# Patient Record
Sex: Female | Born: 1970 | Race: White | Hispanic: No | Marital: Married | Smoking: Never smoker
Health system: Southern US, Community
[De-identification: ages and names within clinical notes are randomized; demographics above are authoritative.]

## PROBLEM LIST (undated history)

## (undated) DIAGNOSIS — Z889 Allergy status to unspecified drugs, medicaments and biological substances status: Secondary | ICD-10-CM

## (undated) DIAGNOSIS — K219 Gastro-esophageal reflux disease without esophagitis: Secondary | ICD-10-CM

---

## 2015-01-17 ENCOUNTER — Emergency Department (HOSPITAL_COMMUNITY): Payer: Self-pay

## 2015-01-17 ENCOUNTER — Encounter (HOSPITAL_COMMUNITY): Payer: Self-pay | Admitting: Emergency Medicine

## 2015-01-17 ENCOUNTER — Emergency Department (HOSPITAL_COMMUNITY)
Admission: EM | Admit: 2015-01-17 | Discharge: 2015-01-17 | Disposition: A | Discharge status: Home / Self Care | Payer: Self-pay | Attending: Emergency Medicine | Admitting: Emergency Medicine

## 2015-01-17 DIAGNOSIS — R0789 Other chest pain: Secondary | ICD-10-CM | POA: Insufficient documentation

## 2015-01-17 DIAGNOSIS — R079 Chest pain, unspecified: Secondary | ICD-10-CM

## 2015-01-17 DIAGNOSIS — K219 Gastro-esophageal reflux disease without esophagitis: Secondary | ICD-10-CM | POA: Insufficient documentation

## 2015-01-17 DIAGNOSIS — Z79899 Other long term (current) drug therapy: Secondary | ICD-10-CM | POA: Insufficient documentation

## 2015-01-17 HISTORY — DX: Gastro-esophageal reflux disease without esophagitis: K21.9

## 2015-01-17 LAB — BASIC METABOLIC PANEL
ANION GAP: 10 (ref 5–15)
BUN: 17 mg/dL (ref 6–20)
CALCIUM: 10.4 mg/dL — AB (ref 8.9–10.3)
CO2: 31 mmol/L (ref 22–32)
Chloride: 102 mmol/L (ref 101–111)
Creatinine, Ser: 1.1 mg/dL — ABNORMAL HIGH (ref 0.44–1.00)
GLUCOSE: 90 mg/dL (ref 65–99)
POTASSIUM: 4 mmol/L (ref 3.5–5.1)
Sodium: 143 mmol/L (ref 135–145)

## 2015-01-17 LAB — CBC
HEMATOCRIT: 42.7 % (ref 36.0–46.0)
HEMOGLOBIN: 14.4 g/dL (ref 12.0–15.0)
MCH: 30.8 pg (ref 26.0–34.0)
MCHC: 33.7 g/dL (ref 30.0–36.0)
MCV: 91.2 fL (ref 78.0–100.0)
Platelets: 191 10*3/uL (ref 150–400)
RBC: 4.68 MIL/uL (ref 3.87–5.11)
RDW: 12.4 % (ref 11.5–15.5)
WBC: 9.7 10*3/uL (ref 4.0–10.5)

## 2015-01-17 LAB — I-STAT TROPONIN, ED: TROPONIN I, POC: 0 ng/mL (ref 0.00–0.08)

## 2015-01-17 MED ORDER — KETOROLAC TROMETHAMINE 30 MG/ML IJ SOLN
30.0000 mg | Freq: Once | INTRAMUSCULAR | Status: AC
  Administered 2015-01-17: 30 mg via INTRAVENOUS
  Filled 2015-01-17: qty 1

## 2015-01-17 MED ORDER — NAPROXEN 500 MG PO TABS: 500.0000 mg | ORAL_TABLET | Freq: Two times a day (BID) | ORAL | Status: AC

## 2015-01-17 NOTE — ED Provider Notes (Signed)
CSN: [id number]     Arrival date & time 01/17/15  1952 History   First MD Initiated Contact with Patient 01/17/15 2000     Chief Complaint  Patient presents with  . Chest Pain     (Consider location/radiation/quality/duration/timing/severity/associated sxs/prior Treatment) HPI  44 year old female presents with chest tightness and chest pain. Started last night when she was going to bed. Was mild and felt like her GERD associated to a ranitidine which resolved her pain. However today around 12 PM she has recurrence of the pain that was more severe. Did not radiate but occasionally feels like a spasm and tightening in her left chest. Does not hurt to breathe. No exertional symptoms. Pain has been constant although does occasionally worsen throughout the day. No shortness of breath, although sometimes it feels like she can't get in a full breath. No diaphoresis or vomiting. No radiation of the pain. Denies any history of hypertension, hyperlipidemia, diabetes, coronary disease, early coronary disease in her family, or smoking. No leg swelling, leg pain, immobilization, estrogen use, or hemoptysis. Rates her pain as an 8.  Past Medical History  Diagnosis Date  . GERD (gastroesophageal reflux disease)    History reviewed. No pertinent past surgical history. No family history on file. Social History  Substance Use Topics  . Smoking status: Never Smoker   . Smokeless tobacco: None  . Alcohol Use: No   OB History    No data available     Review of Systems  Constitutional: Negative for fever.  Respiratory: Negative for cough and shortness of breath.   Cardiovascular: Positive for chest pain. Negative for leg swelling.  Gastrointestinal: Negative for vomiting and abdominal pain.  All other systems reviewed and are negative.     Allergies  Review of patient's allergies indicates no known allergies.  Home Medications   Prior to Admission medications   Medication Sig Start Date End  Date Taking? Authorizing Provider  ranitidine (ZANTAC) 150 MG tablet Take 150 mg by mouth 2 (two) times daily.   Yes Historical Provider, MD  naproxen (NAPROSYN) 500 MG tablet Take 1 tablet (500 mg total) by mouth 2 (two) times daily. 01/17/15   Pricilla Loveless, MD   BP 125/75 mmHg  Pulse 55  Temp(Src) 97.5 F (36.4 C) (Oral)  Resp 15  Wt 163 lb 6.4 oz (74.118 kg)  SpO2 99% Physical Exam  Constitutional: She is oriented to person, place, and time. She appears well-developed and well-nourished.  HENT:  Head: Normocephalic and atraumatic.  Right Ear: External ear normal.  Left Ear: External ear normal.  Nose: Nose normal.  Eyes: Right eye exhibits no discharge. Left eye exhibits no discharge.  Cardiovascular: Normal rate, regular rhythm and normal heart sounds.   Pulmonary/Chest: Effort normal and breath sounds normal. She exhibits tenderness.    Abdominal: Soft. There is no tenderness.  Musculoskeletal: She exhibits no edema.  Neurological: She is alert and oriented to person, place, and time.  Skin: Skin is warm and dry.  Nursing note and vitals reviewed.   ED Course  Procedures (including critical care time) Labs Review Labs Reviewed  BASIC METABOLIC PANEL - Abnormal; Notable for the following:    Creatinine, Ser 1.10 (*)    Calcium 10.4 (*)    All other components within normal limits  CBC  I-STAT TROPOININ, ED  Rosezena Sensor, ED    Imaging Review Dg Chest 2 View  01/17/2015  CLINICAL DATA:  Chest tightness and shortness of breath. EXAM: CHEST  2 VIEW COMPARISON:  None. FINDINGS: Normal sized heart.  Clear lungs.  Minimal scoliosis. IMPRESSION: No acute abnormality. Electronically Signed   By: Beckie SaltsSteven  Reid M.D.   On: 01/17/2015 20:42   I have personally reviewed and evaluated these images and lab results as part of my medical decision-making.   EKG Interpretation   Date/Time:  Friday January 17 2015 19:57:51 EDT Ventricular Rate:  88 PR Interval:  150 QRS  Duration: 98 QT Interval:  362 QTC Calculation: 438 R Axis:   60 Text Interpretation:  Normal sinus rhythm with sinus arrhythmia Incomplete  right bundle branch block Borderline ECG no acute ST/T changes No old  tracing to compare Confirmed by Ashley Montminy  MD, Pearlena Ow ([id number]) on 01/17/2015  8:08:46 PM      MDM   Final diagnoses:  Chest pain, unspecified chest pain type    Patient is low risk for ACS. Is also low risk for pulmonary embolism and is PERC negative. Do not feel further testing is warranted for PE. Initial EKG unremarkable, initial troponin after 8 hours of continuous symptoms is negative. I have recommended a second troponin. Her pain is significantly better after IV Toradol. Given her reproducible symptoms as well as having a URI last week and think this is most likely a costochondritis. She does not want to wait on the second troponin that she needs to get home to her family. I've counseled on the risks of a possible missed MI although do feel this is less likely. She excepts this and will go home and will return if any symptoms worsen. She has plans to see her PCP as soon as possible the next couple days.    Pricilla LovelessScott Chrisann Melaragno, MD 01/18/15 [phone]

## 2015-01-17 NOTE — ED Notes (Signed)
MD at bedside. 

## 2015-01-17 NOTE — ED Notes (Signed)
Pt. reports intermittent left chest pain " spasms" onset last night with SOB and nausea , denies diaphoresis .

## 2017-01-23 ENCOUNTER — Telehealth (HOSPITAL_BASED_OUTPATIENT_CLINIC_OR_DEPARTMENT_OTHER): Payer: Self-pay | Admitting: Emergency Medicine

## 2017-01-23 ENCOUNTER — Other Ambulatory Visit: Payer: Self-pay

## 2017-01-23 ENCOUNTER — Emergency Department (HOSPITAL_BASED_OUTPATIENT_CLINIC_OR_DEPARTMENT_OTHER): Payer: Self-pay

## 2017-01-23 ENCOUNTER — Encounter (HOSPITAL_BASED_OUTPATIENT_CLINIC_OR_DEPARTMENT_OTHER): Payer: Self-pay | Admitting: *Deleted

## 2017-01-23 ENCOUNTER — Emergency Department (HOSPITAL_BASED_OUTPATIENT_CLINIC_OR_DEPARTMENT_OTHER)
Admission: EM | Admit: 2017-01-23 | Discharge: 2017-01-23 | Disposition: A | Discharge status: Home / Self Care | Payer: Self-pay | Attending: Emergency Medicine | Admitting: Emergency Medicine

## 2017-01-23 DIAGNOSIS — S82831A Other fracture of upper and lower end of right fibula, initial encounter for closed fracture: Secondary | ICD-10-CM | POA: Insufficient documentation

## 2017-01-23 DIAGNOSIS — W010XXA Fall on same level from slipping, tripping and stumbling without subsequent striking against object, initial encounter: Secondary | ICD-10-CM | POA: Insufficient documentation

## 2017-01-23 DIAGNOSIS — Y999 Unspecified external cause status: Secondary | ICD-10-CM | POA: Insufficient documentation

## 2017-01-23 DIAGNOSIS — Z79899 Other long term (current) drug therapy: Secondary | ICD-10-CM | POA: Insufficient documentation

## 2017-01-23 DIAGNOSIS — Y9389 Activity, other specified: Secondary | ICD-10-CM | POA: Insufficient documentation

## 2017-01-23 DIAGNOSIS — Y929 Unspecified place or not applicable: Secondary | ICD-10-CM | POA: Insufficient documentation

## 2017-01-23 HISTORY — DX: Allergy status to unspecified drugs, medicaments and biological substances: Z88.9

## 2017-01-23 MED ORDER — IBUPROFEN 800 MG PO TABS: 800.0000 mg | ORAL_TABLET | Freq: Three times a day (TID) | ORAL | 0 refills | Status: AC

## 2017-01-23 MED ORDER — HYDROCODONE-ACETAMINOPHEN 5-325 MG PO TABS
2.0000 | ORAL_TABLET | Freq: Once | ORAL | Status: AC
  Administered 2017-01-23: 2 via ORAL
  Filled 2017-01-23: qty 2

## 2017-01-23 MED ORDER — HYDROCODONE-ACETAMINOPHEN 5-325 MG PO TABS: 2.0000 | ORAL_TABLET | ORAL | 0 refills | Status: DC | PRN

## 2017-01-23 NOTE — Discharge Instructions (Signed)
Your ankle shows that you have a fracture, we have applied a splint.  Use crutches.  Do not bear any weight on your ankle.  You may need surgery for this.  Alternatively this may heal very well without surgery but the orthopedic surgeon will need to see you in the office to make that determination.  You may take ibuprofen 3 times a day.  You may take hydrocodone with acetaminophen every 4-6 hours as needed for pain.  Please be aware that this can cause constipation so take a stool softener with it.

## 2017-01-23 NOTE — ED Provider Notes (Signed)
[facility] EMERGENCY DEPARTMENT Provider Note   CSN: [id number] Arrival date & time: 01/23/17  0720     History   Chief Complaint Chief Complaint  Patient presents with  . Ankle Pain    HPI Alicia Nunez is a 46 y.o. female.  HPI  The patient states that approximately 1-1/2 hours ago she was walking her dog when she slipped on frosty ground wearing flip-flops, she fell turning her right ankle and having acute onset of pain.  She is able to ambulate extremely difficulty with a lot of pain in the right ankle.  She has been avoiding that.  She does notice some swelling with it.  The pain is constant, severe, worse with ambulation.  Denies any knee pain or any other injuries.  Past Medical History:  Diagnosis Date  . GERD (gastroesophageal reflux disease)   . H/O seasonal allergies     There are no active problems to display for this patient.   Past Surgical History:  Procedure Laterality Date  . CESAREAN SECTION      OB History    No data available       Home Medications    Prior to Admission medications   Medication Sig Start Date End Date Taking? Authorizing Provider  Fexofenadine HCl (ALLEGRA ALLERGY PO) Take by mouth.   Yes [provider]  HYDROcodone-acetaminophen (NORCO/VICODIN) 5-325 MG tablet Take 2 tablets every 4 (four) hours as needed by mouth. 01/23/17   Eber HongMiller, Chameka Mcmullen, MD  ibuprofen (ADVIL,MOTRIN) 800 MG tablet Take 1 tablet (800 mg total) 3 (three) times daily by mouth. 01/23/17   Eber HongMiller, Oliver Heitzenrater, MD  naproxen (NAPROSYN) 500 MG tablet Take 1 tablet (500 mg total) by mouth 2 (two) times daily. 01/17/15   Pricilla LovelessGoldston, Scott, MD  ranitidine (ZANTAC) 150 MG tablet Take 150 mg by mouth 2 (two) times daily.    [provider]    Family History No family history on file.  Social History Social History   Tobacco Use  . Smoking status: Never Smoker  . Smokeless tobacco: Never Used  Substance Use Topics  . Alcohol use: No  .  Drug use: No     Allergies   Patient has no known allergies.   Review of Systems Review of Systems  Gastrointestinal: Negative for nausea and vomiting.  Musculoskeletal: Positive for joint swelling ( ankle). Negative for back pain and neck pain.  Neurological: Negative for weakness and numbness.     Physical Exam Updated Vital Signs BP (!) 142/85 (BP Location: Right Arm)   Pulse (!) 112   Temp 97.6 F (36.4 C)   LMP  (Exact Date)   SpO2 100%   Physical Exam  Constitutional: She appears well-developed and well-nourished. No distress.  HENT:  Head: Normocephalic and atraumatic.  Eyes: Conjunctivae are normal. No scleral icterus.  Cardiovascular: Normal rate, regular rhythm and intact distal pulses.  Pulmonary/Chest: Effort normal and breath sounds normal.  Musculoskeletal: She exhibits tenderness ( Pain and swelling over the right lateral malleolus.  There is mild deformity due to the swelling laterally.  Decreased Dieguez of motion of the foot.) and deformity. She exhibits no edema.  Neurological: She is alert.  Sensation intact distal to the ankle.  Skin: Skin is warm and dry. No rash noted. She is not diaphoretic.  HEENT intact, no open wounds  Nursing note and vitals reviewed.    ED Treatments / Results  Labs (all labs ordered are listed, but only abnormal results are  displayed) Labs Reviewed - No data to display   Radiology Dg Ankle Complete Right  Result Date: 01/23/2017 CLINICAL DATA:  Twisting injury to the right ankle.  Pain. EXAM: RIGHT ANKLE - COMPLETE 3+ VIEW COMPARISON:  None. FINDINGS: There is a comminuted oblique fracture of the distal fibula with mild posterior dislocation of the distal fracture fragment. The fracture line extends to ankle mortise, which does not appear widened. There is an associated soft tissue swelling about the lateral ankle. IMPRESSION: Comminution oblique mildly displaced intra-articular distal fibular fracture. Electronically  Signed   By: Ted Mcalpineobrinka  Dimitrova M.D.   On: 01/23/2017 07:53    Procedures .Splint Application Date/Time: 01/23/2017 8:15 AM Performed by: Eber HongMiller, Natonya Finstad, MD Authorized by: Eber HongMiller, Cenia Zaragosa, MD   Consent:    Consent obtained:  Verbal   Consent given by:  Patient   Risks discussed:  Numbness, pain, swelling and discoloration   Alternatives discussed:  No treatment Pre-procedure details:    Sensation:  Normal   Skin color:  Normal Procedure details:    Laterality:  Right   Location:  Ankle   Ankle:  R ankle   Cast type:  Short leg (stirrup and posterior)   Supplies:  Cotton padding and Ortho-Glass Post-procedure details:    Pain:  Improved   Sensation:  Normal   Patient tolerance of procedure:  Tolerated well, no immediate complications   (including critical care time)  Medications Ordered in ED Medications  HYDROcodone-acetaminophen (NORCO/VICODIN) 5-325 MG per tablet 2 tablet (2 tablets Oral Given 01/23/17 [id number])     Initial Impression / Assessment and Plan / ED Course  I have reviewed the triage vital signs and the nursing notes.  Pertinent labs & imaging results that were available during my care of the patient were reviewed by me and considered in my medical decision making (see chart for details).     Evaluate for fracture, likely sprain versus distal fibular fracture, rice therapy, patient agreeable to the plan.  I have interpreted and reviewed the x-rays, a complete right ankle series was performed, there is a comminuted minimally posterior displaced distal fibular fragment with retention of the ankle mortise.  I have performed the splinting.  The patient has been instructed on care of this injury, she will be given prescriptions for both ibuprofen and 10 hydrocodone tablets in the referral to the orthopedist on call, Dr. Roda ShuttersXu.  Patient expressed her understanding.  Final Clinical Impressions(s) / ED Diagnoses   Final diagnoses:  Closed fracture of distal end of  right fibula, unspecified fracture morphology, initial encounter    ED Discharge Orders        Ordered    ibuprofen (ADVIL,MOTRIN) 800 MG tablet  3 times daily     01/23/17 0758    HYDROcodone-acetaminophen (NORCO/VICODIN) 5-325 MG tablet  Every 4 hours PRN     01/23/17 0758       Eber HongMiller, Anari Evitt, MD 01/23/17 [phone]

## 2017-01-23 NOTE — ED Triage Notes (Signed)
Pt states that she was walking her dog this morning in her flip flops when she slid on the wet grass and her right ankle went one way and her body the other. Swelling noted to right outer aspect of ankle. No other injury noted. Ice pack placed to ankle and elevated. Awaiting xray.

## 2017-01-23 NOTE — ED Notes (Signed)
MD applied and reviewed splint.

## 2017-01-25 ENCOUNTER — Encounter (INDEPENDENT_AMBULATORY_CARE_PROVIDER_SITE_OTHER): Payer: Self-pay | Admitting: Orthopaedic Surgery

## 2017-01-25 ENCOUNTER — Ambulatory Visit (INDEPENDENT_AMBULATORY_CARE_PROVIDER_SITE_OTHER): Payer: Self-pay | Admitting: Orthopaedic Surgery

## 2017-01-25 DIAGNOSIS — S8261XA Displaced fracture of lateral malleolus of right fibula, initial encounter for closed fracture: Secondary | ICD-10-CM

## 2017-01-25 NOTE — Progress Notes (Signed)
   Office Visit Note   Patient: Alicia Nunez A Whetsel           Date of Birth: 12/08/1970           MRN: [id number] Visit Date: 01/25/2017              Requested by: Lester CarolinaMcFadden, John C., MD [address] [place], [place] [address] PCP: Lester CarolinaMcFadden, John C., MD   Assessment & Plan: Visit Diagnoses:  1. Displaced fracture of lateral malleolus of right fibula, initial encounter for closed fracture     Plan: Impression is displaced right lateral malleolus fracture.  X-rays were reviewed with the patient and her husband.  Recommendation is for ORIF.  We discussed the risk benefits alternatives of surgery and they understand and wish to proceed.  We will plan on surgery tomorrow as an outpatient.  Follow-Up Instructions: Return in about 2 weeks (around 02/08/2017).   Orders:  No orders of the defined types were placed in this encounter.  No orders of the defined types were placed in this encounter.     Procedures: No procedures performed   Clinical Data: No additional findings.   Subjective: Chief Complaint  Patient presents with  . Right Ankle - Pain    Alicia Nunez is a 46 year old female comes in with a displaced right fibula fracture from a mechanical fall she sustained while walking her dog Sunday morning.  She endorses pain and discomfort.  The pain is worse with activity and better with elevation and rest.  Denies any numbness and tingling.  Denies any radiation of pain.    Review of Systems  Constitutional: Negative.   HENT: Negative.   Eyes: Negative.   Respiratory: Negative.   Cardiovascular: Negative.   Endocrine: Negative.   Musculoskeletal: Negative.   Neurological: Negative.   Hematological: Negative.   Psychiatric/Behavioral: Negative.   All other systems reviewed and are negative.    Objective: Vital Signs: There were no vitals taken for this visit.  Physical Exam  Constitutional: She is oriented to person, place, and time. She appears well-developed and  well-nourished.  HENT:  Head: Normocephalic and atraumatic.  Eyes: EOM are normal.  Neck: Neck supple.  Pulmonary/Chest: Effort normal.  Abdominal: Soft.  Neurological: She is alert and oriented to person, place, and time.  Skin: Skin is warm. Capillary refill takes less than 2 seconds.  Psychiatric: She has a normal mood and affect. Her behavior is normal. Judgment and thought content normal.  Nursing note and vitals reviewed.   Ortho Exam Right ankle exam shows mild swelling without any neurovascular compromise.  Foot is warm well perfused. Specialty Comments:  No specialty comments available.  Imaging: No results found.   PMFS History: Patient Active Problem List   Diagnosis Date Noted  . Displaced fracture of lateral malleolus of right fibula, initial encounter for closed fracture 01/25/2017   Past Medical History:  Diagnosis Date  . GERD (gastroesophageal reflux disease)   . H/O seasonal allergies     History reviewed. No pertinent family history.  Past Surgical History:  Procedure Laterality Date  . CESAREAN SECTION     Social History   Occupational History  . Not on file  Tobacco Use  . Smoking status: Never Smoker  . Smokeless tobacco: Never Used  Substance and Sexual Activity  . Alcohol use: No  . Drug use: No  . Sexual activity: Not on file

## 2017-01-26 ENCOUNTER — Ambulatory Visit (HOSPITAL_COMMUNITY): Payer: Self-pay

## 2017-01-26 ENCOUNTER — Other Ambulatory Visit (INDEPENDENT_AMBULATORY_CARE_PROVIDER_SITE_OTHER): Payer: Self-pay | Admitting: Orthopaedic Surgery

## 2017-01-26 ENCOUNTER — Ambulatory Visit (HOSPITAL_BASED_OUTPATIENT_CLINIC_OR_DEPARTMENT_OTHER): Payer: Self-pay | Admitting: Certified Registered Nurse Anesthetist

## 2017-01-26 ENCOUNTER — Ambulatory Visit (HOSPITAL_BASED_OUTPATIENT_CLINIC_OR_DEPARTMENT_OTHER)
Admission: RE | Admit: 2017-01-26 | Discharge: 2017-01-26 | Disposition: A | Discharge status: Home / Self Care | Payer: Self-pay | Source: Ambulatory Visit | Attending: Orthopaedic Surgery | Admitting: Orthopaedic Surgery

## 2017-01-26 ENCOUNTER — Other Ambulatory Visit: Payer: Self-pay

## 2017-01-26 ENCOUNTER — Encounter (HOSPITAL_BASED_OUTPATIENT_CLINIC_OR_DEPARTMENT_OTHER)
Admission: RE | Disposition: A | Discharge status: Home / Self Care | Payer: Self-pay | Source: Ambulatory Visit | Attending: Orthopaedic Surgery

## 2017-01-26 ENCOUNTER — Encounter (HOSPITAL_BASED_OUTPATIENT_CLINIC_OR_DEPARTMENT_OTHER): Payer: Self-pay

## 2017-01-26 DIAGNOSIS — S82891A Other fracture of right lower leg, initial encounter for closed fracture: Secondary | ICD-10-CM

## 2017-01-26 DIAGNOSIS — X58XXXA Exposure to other specified factors, initial encounter: Secondary | ICD-10-CM | POA: Insufficient documentation

## 2017-01-26 DIAGNOSIS — Z79891 Long term (current) use of opiate analgesic: Secondary | ICD-10-CM | POA: Insufficient documentation

## 2017-01-26 DIAGNOSIS — S8261XA Displaced fracture of lateral malleolus of right fibula, initial encounter for closed fracture: Secondary | ICD-10-CM | POA: Insufficient documentation

## 2017-01-26 DIAGNOSIS — K219 Gastro-esophageal reflux disease without esophagitis: Secondary | ICD-10-CM | POA: Insufficient documentation

## 2017-01-26 DIAGNOSIS — Y9289 Other specified places as the place of occurrence of the external cause: Secondary | ICD-10-CM | POA: Insufficient documentation

## 2017-01-26 DIAGNOSIS — Z791 Long term (current) use of non-steroidal anti-inflammatories (NSAID): Secondary | ICD-10-CM | POA: Insufficient documentation

## 2017-01-26 DIAGNOSIS — Z79899 Other long term (current) drug therapy: Secondary | ICD-10-CM | POA: Insufficient documentation

## 2017-01-26 HISTORY — PX: ORIF ANKLE FRACTURE: SHX5408

## 2017-01-26 SURGERY — OPEN REDUCTION INTERNAL FIXATION (ORIF) ANKLE FRACTURE
Anesthesia: General | Site: Ankle | Laterality: Right

## 2017-01-26 MED ORDER — BUPIVACAINE-EPINEPHRINE (PF) 0.5% -1:200000 IJ SOLN
INTRAMUSCULAR | Status: DC | PRN
  Administered 2017-01-26: 30 mL via PERINEURAL

## 2017-01-26 MED ORDER — ONDANSETRON HCL 4 MG/2ML IJ SOLN
INTRAMUSCULAR | Status: AC
  Filled 2017-01-26: qty 2

## 2017-01-26 MED ORDER — CEFAZOLIN SODIUM-DEXTROSE 2-4 GM/100ML-% IV SOLN
2.0000 g | INTRAVENOUS | Status: AC
Start: 2017-01-27 — End: 2017-01-26
  Administered 2017-01-26: 2 g via INTRAVENOUS

## 2017-01-26 MED ORDER — SCOPOLAMINE 1 MG/3DAYS TD PT72: 1.0000 | MEDICATED_PATCH | Freq: Once | TRANSDERMAL | Status: DC | PRN

## 2017-01-26 MED ORDER — PROMETHAZINE HCL 25 MG PO TABS: 25.0000 mg | ORAL_TABLET | Freq: Four times a day (QID) | ORAL | 1 refills | Status: AC | PRN

## 2017-01-26 MED ORDER — MIDAZOLAM HCL 2 MG/2ML IJ SOLN
INTRAMUSCULAR | Status: AC
  Filled 2017-01-26: qty 2

## 2017-01-26 MED ORDER — DEXAMETHASONE SODIUM PHOSPHATE 10 MG/ML IJ SOLN
INTRAMUSCULAR | Status: AC
  Filled 2017-01-26: qty 1

## 2017-01-26 MED ORDER — LACTATED RINGERS IV SOLN
INTRAVENOUS | Status: DC | PRN
  Administered 2017-01-26 (×2): via INTRAVENOUS

## 2017-01-26 MED ORDER — TIZANIDINE HCL 4 MG PO TABS: 4.0000 mg | ORAL_TABLET | Freq: Four times a day (QID) | ORAL | 2 refills | Status: AC | PRN

## 2017-01-26 MED ORDER — SENNOSIDES-DOCUSATE SODIUM 8.6-50 MG PO TABS: 1.0000 | ORAL_TABLET | Freq: Every evening | ORAL | 1 refills | Status: AC | PRN

## 2017-01-26 MED ORDER — OXYCODONE-ACETAMINOPHEN 5-325 MG PO TABS: 1.0000 | ORAL_TABLET | ORAL | 0 refills | Status: AC | PRN

## 2017-01-26 MED ORDER — LACTATED RINGERS IV SOLN: INTRAVENOUS | Status: DC

## 2017-01-26 MED ORDER — MIDAZOLAM HCL 2 MG/2ML IJ SOLN
1.0000 mg | INTRAMUSCULAR | Status: DC | PRN
  Administered 2017-01-26: 2 mg via INTRAVENOUS

## 2017-01-26 MED ORDER — LACTATED RINGERS IV SOLN: 500.0000 mL | INTRAVENOUS | Status: DC

## 2017-01-26 MED ORDER — DEXAMETHASONE SODIUM PHOSPHATE 10 MG/ML IJ SOLN
INTRAMUSCULAR | Status: DC | PRN
Start: 2017-01-26 — End: 2017-01-26
  Administered 2017-01-26: 10 mg via INTRAVENOUS

## 2017-01-26 MED ORDER — FENTANYL CITRATE (PF) 100 MCG/2ML IJ SOLN
50.0000 ug | INTRAMUSCULAR | Status: DC | PRN
  Administered 2017-01-26: 100 ug via INTRAVENOUS

## 2017-01-26 MED ORDER — ROPIVACAINE HCL 5 MG/ML IJ SOLN
INTRAMUSCULAR | Status: DC | PRN
  Administered 2017-01-26: 20 mL via PERINEURAL

## 2017-01-26 MED ORDER — ASPIRIN EC 325 MG PO TBEC: 325.0000 mg | DELAYED_RELEASE_TABLET | Freq: Two times a day (BID) | ORAL | 0 refills | Status: AC

## 2017-01-26 MED ORDER — PROPOFOL 10 MG/ML IV BOLUS
INTRAVENOUS | Status: DC | PRN
  Administered 2017-01-26: 200 mg via INTRAVENOUS

## 2017-01-26 MED ORDER — FENTANYL CITRATE (PF) 100 MCG/2ML IJ SOLN
INTRAMUSCULAR | Status: AC
  Filled 2017-01-26: qty 2

## 2017-01-26 MED ORDER — ONDANSETRON HCL 4 MG/2ML IJ SOLN
INTRAMUSCULAR | Status: DC | PRN
Start: 2017-01-26 — End: 2017-01-26
  Administered 2017-01-26: 4 mg via INTRAVENOUS

## 2017-01-26 MED ORDER — PROMETHAZINE HCL 25 MG/ML IJ SOLN: 6.2500 mg | INTRAMUSCULAR | Status: DC | PRN

## 2017-01-26 MED ORDER — FENTANYL CITRATE (PF) 100 MCG/2ML IJ SOLN: 25.0000 ug | INTRAMUSCULAR | Status: DC | PRN

## 2017-01-26 MED ORDER — 0.9 % SODIUM CHLORIDE (POUR BTL) OPTIME
TOPICAL | Status: DC | PRN
  Administered 2017-01-26: 3000 mL

## 2017-01-26 MED ORDER — PROPOFOL 10 MG/ML IV BOLUS
INTRAVENOUS | Status: AC
  Filled 2017-01-26: qty 20

## 2017-01-26 MED ORDER — LIDOCAINE 2% (20 MG/ML) 5 ML SYRINGE
INTRAMUSCULAR | Status: DC | PRN
  Administered 2017-01-26: 20 mg via INTRAVENOUS

## 2017-01-26 MED ORDER — FENTANYL CITRATE (PF) 100 MCG/2ML IJ SOLN
INTRAMUSCULAR | Status: DC | PRN
  Administered 2017-01-26 (×2): 25 ug via INTRAVENOUS

## 2017-01-26 MED ORDER — LIDOCAINE 2% (20 MG/ML) 5 ML SYRINGE
INTRAMUSCULAR | Status: AC
  Filled 2017-01-26: qty 5

## 2017-01-26 MED ORDER — CEFAZOLIN SODIUM-DEXTROSE 2-4 GM/100ML-% IV SOLN
INTRAVENOUS | Status: AC
  Filled 2017-01-26: qty 100

## 2017-01-26 MED ORDER — ONDANSETRON HCL 4 MG PO TABS: 4.0000 mg | ORAL_TABLET | Freq: Three times a day (TID) | ORAL | 0 refills | Status: AC | PRN

## 2017-01-26 SURGICAL SUPPLY — 89 items
BANDAGE ACE 4X5 VEL STRL LF (GAUZE/BANDAGES/DRESSINGS) ×3 IMPLANT
BANDAGE ACE 6X5 VEL STRL LF (GAUZE/BANDAGES/DRESSINGS) ×3 IMPLANT
BANDAGE ESMARK 6X9 LF (GAUZE/BANDAGES/DRESSINGS) ×1 IMPLANT
BIT DRILL 110X2.5XQCK CNCT (BIT) ×1 IMPLANT
BIT DRILL 2.5 (BIT) ×2
BIT DRL 110X2.5XQCK CNCT (BIT) ×1
BLADE HEX COATED 2.75 (ELECTRODE) IMPLANT
BLADE SURG 15 STRL LF DISP TIS (BLADE) ×2 IMPLANT
BLADE SURG 15 STRL SS (BLADE) ×4
BNDG COHESIVE 6X5 TAN STRL LF (GAUZE/BANDAGES/DRESSINGS) ×3 IMPLANT
BNDG ESMARK 6X9 LF (GAUZE/BANDAGES/DRESSINGS) ×3
BRUSH SCRUB EZ PLAIN DRY (MISCELLANEOUS) ×3 IMPLANT
CANISTER SUCT 1200ML W/VALVE (MISCELLANEOUS) ×3 IMPLANT
CORD BIPOLAR FORCEPS 12FT (ELECTRODE) ×6 IMPLANT
COVER BACK TABLE 60X90IN (DRAPES) ×3 IMPLANT
COVER MAYO STAND STRL (DRAPES) ×3 IMPLANT
CUFF TOURNIQUET SINGLE 34IN LL (TOURNIQUET CUFF) ×3 IMPLANT
DECANTER SPIKE VIAL GLASS SM (MISCELLANEOUS) IMPLANT
DRAPE C-ARM 42X72 X-RAY (DRAPES) ×3 IMPLANT
DRAPE C-ARMOR (DRAPES) ×3 IMPLANT
DRAPE EXTREMITY T 121X128X90 (DRAPE) ×3 IMPLANT
DRAPE IMP U-DRAPE 54X76 (DRAPES) ×3 IMPLANT
DRAPE SURG 17X23 STRL (DRAPES) ×6 IMPLANT
DRILL BIT 2.0 MM ×3 IMPLANT
DRSG PAD ABDOMINAL 8X10 ST (GAUZE/BANDAGES/DRESSINGS) ×6 IMPLANT
DURAPREP 26ML APPLICATOR (WOUND CARE) ×3 IMPLANT
ELECT REM PT RETURN 9FT ADLT (ELECTROSURGICAL)
ELECTRODE REM PT RTRN 9FT ADLT (ELECTROSURGICAL) IMPLANT
GAUZE SPONGE 4X4 12PLY STRL (GAUZE/BANDAGES/DRESSINGS) ×3 IMPLANT
GAUZE XEROFORM 1X8 LF (GAUZE/BANDAGES/DRESSINGS) ×3 IMPLANT
GLOVE BIO SURGEON STRL SZ7.5 (GLOVE) ×3 IMPLANT
GLOVE BIOGEL PI IND STRL 8 (GLOVE) ×1 IMPLANT
GLOVE BIOGEL PI INDICATOR 8 (GLOVE) ×2
GLOVE SKINSENSE NS SZ7.5 (GLOVE) ×2
GLOVE SKINSENSE STRL SZ7.5 (GLOVE) ×1 IMPLANT
GLOVE SURG SYN 7.5  E (GLOVE) ×2
GLOVE SURG SYN 7.5 E (GLOVE) ×1 IMPLANT
GOWN STRL REIN XL XLG (GOWN DISPOSABLE) ×3 IMPLANT
GOWN STRL REUS W/ TWL LRG LVL3 (GOWN DISPOSABLE) ×1 IMPLANT
GOWN STRL REUS W/ TWL XL LVL3 (GOWN DISPOSABLE) ×1 IMPLANT
GOWN STRL REUS W/TWL LRG LVL3 (GOWN DISPOSABLE) ×2
GOWN STRL REUS W/TWL XL LVL3 (GOWN DISPOSABLE) ×2
MANIFOLD NEPTUNE II (INSTRUMENTS) ×6 IMPLANT
NEEDLE HYPO 22GX1.5 SAFETY (NEEDLE) IMPLANT
NS IRRIG 1000ML POUR BTL (IV SOLUTION) ×3 IMPLANT
PACK BASIN DAY SURGERY FS (CUSTOM PROCEDURE TRAY) ×3 IMPLANT
PAD CAST 3X4 CTTN HI CHSV (CAST SUPPLIES) IMPLANT
PAD CAST 4YDX4 CTTN HI CHSV (CAST SUPPLIES) ×1 IMPLANT
PADDING CAST COTTON 3X4 STRL (CAST SUPPLIES)
PADDING CAST COTTON 4X4 STRL (CAST SUPPLIES) ×2
PADDING CAST COTTON 6X4 STRL (CAST SUPPLIES) ×3 IMPLANT
PADDING CAST SYN 6 (CAST SUPPLIES)
PADDING CAST SYNTHETIC 4 (CAST SUPPLIES) ×4
PADDING CAST SYNTHETIC 4X4 STR (CAST SUPPLIES) ×2 IMPLANT
PADDING CAST SYNTHETIC 6X4 NS (CAST SUPPLIES) IMPLANT
PENCIL BUTTON HOLSTER BLD 10FT (ELECTRODE) IMPLANT
PLATE TIBULA RT PERIART 4H (Plate) ×3 IMPLANT
SCREW 3.5X16MM (Screw) ×4 IMPLANT
SCREW BN 2.7X16X3.5XST NS (Screw) ×2 IMPLANT
SCREW LOCK 10X2.7XNS ELB (Screw) ×1 IMPLANT
SCREW LOCK 16X2.7X (Screw) ×1 IMPLANT
SCREW LOCK 16X3.5X2.7X NS (Screw) ×1 IMPLANT
SCREW LOCK ~~LOC~~ 2.7X16 (Screw) ×3 IMPLANT
SCREW LOCKING 2.7X10MM (Screw) ×2 IMPLANT
SCREW LOCKING 2.7X16 (Screw) ×2 IMPLANT
SCREW LOCKING 3.5X16MM (Screw) ×2 IMPLANT
SCREW PERI 3.5X14MM W/2.7 (Screw) ×3 IMPLANT
SHEET MEDIUM DRAPE 40X70 STRL (DRAPES) ×3 IMPLANT
SLEEVE SCD COMPRESS KNEE MED (MISCELLANEOUS) ×3 IMPLANT
SPLINT FIBERGLASS 4X30 (CAST SUPPLIES) ×3 IMPLANT
SPONGE LAP 18X18 X RAY DECT (DISPOSABLE) ×3 IMPLANT
SUCTION FRAZIER HANDLE 10FR (MISCELLANEOUS) ×2
SUCTION TUBE FRAZIER 10FR DISP (MISCELLANEOUS) ×1 IMPLANT
SUT ETHILON 3 0 PS 1 (SUTURE) ×3 IMPLANT
SUT VIC AB 0 CT1 27 (SUTURE) ×2
SUT VIC AB 0 CT1 27XBRD ANBCTR (SUTURE) ×1 IMPLANT
SUT VIC AB 2-0 CT1 27 (SUTURE) ×2
SUT VIC AB 2-0 CT1 TAPERPNT 27 (SUTURE) ×1 IMPLANT
SUT VIC AB 3-0 SH 27 (SUTURE)
SUT VIC AB 3-0 SH 27X BRD (SUTURE) IMPLANT
SYR BULB 3OZ (MISCELLANEOUS) ×3 IMPLANT
SYR CONTROL 10ML LL (SYRINGE) IMPLANT
TOWEL OR 17X24 6PK STRL BLUE (TOWEL DISPOSABLE) ×3 IMPLANT
TOWEL OR NON WOVEN STRL DISP B (DISPOSABLE) ×3 IMPLANT
TRAY DSU PREP LF (CUSTOM PROCEDURE TRAY) ×3 IMPLANT
TUBE CONNECTING 20'X1/4 (TUBING) ×1
TUBE CONNECTING 20X1/4 (TUBING) ×2 IMPLANT
UNDERPAD 30X30 (UNDERPADS AND DIAPERS) ×3 IMPLANT
YANKAUER SUCT BULB TIP NO VENT (SUCTIONS) ×3 IMPLANT

## 2017-01-26 NOTE — Discharge Instructions (Signed)
1. Keep splint clean and dry °2. Elevate foot above level of the heart °3. Take aspirin to prevent blood clots °4. Take pain meds as needed °5. Strict non weight bearing to operative extremity ° ° °Post Anesthesia Home Care Instructions ° °Activity: °Get plenty of rest for the remainder of the day. A responsible individual must stay with you for 24 hours following the procedure.  °For the next 24 hours, DO NOT: °-Drive a car °-Operate machinery °-Drink alcoholic beverages °-Take any medication unless instructed by your physician °-Make any legal decisions or sign important papers. ° °Meals: °Start with liquid foods such as gelatin or soup. Progress to regular foods as tolerated. Avoid greasy, spicy, heavy foods. If nausea and/or vomiting occur, drink only clear liquids until the nausea and/or vomiting subsides. Call your physician if vomiting continues. ° °Special Instructions/Symptoms: °Your throat may feel dry or sore from the anesthesia or the breathing tube placed in your throat during surgery. If this causes discomfort, gargle with warm salt water. The discomfort should disappear within 24 hours. ° °If you had a scopolamine patch placed behind your ear for the management of post- operative nausea and/or vomiting: ° °1. The medication in the patch is effective for 72 hours, after which it should be removed.  Wrap patch in a tissue and discard in the trash. Wash hands thoroughly with soap and water. °2. You may remove the patch earlier than 72 hours if you experience unpleasant side effects which may include dry mouth, dizziness or visual disturbances. °3. Avoid touching the patch. Wash your hands with soap and water after contact with the patch. °   ° °

## 2017-01-26 NOTE — Transfer of Care (Signed)
Immediate Anesthesia Transfer of Care Note  Patient: Alicia Nunez  Procedure(s) Performed: OPEN REDUCTION INTERNAL FIXATION (ORIF) RIGHT ANKLE FRACTURE (Right Ankle)  Patient Location: PACU  Anesthesia Type:GA combined with regional for post-op pain  Level of Consciousness: awake and patient cooperative  Airway & Oxygen Therapy: Patient Spontanous Breathing and Patient connected to face mask oxygen  Post-op Assessment: Report given to RN and Post -op Vital signs reviewed and stable  Post vital signs: Reviewed and stable  Last Vitals:  Vitals:   01/26/17 1520 01/26/17 1530  BP: 116/76 125/78  Pulse: 93 92  Resp: 14 14  Temp:    SpO2: 100% 100%    Last Pain:  Vitals:   01/26/17 1503  PainSc: 0-No pain         Complications: No apparent anesthesia complications

## 2017-01-26 NOTE — Op Note (Signed)
   Date of Surgery: 01/26/2017  INDICATIONS: [name] is a 46 y.o.-year-old female who sustained a right ankle fracture; she was indicated for open reduction and internal fixation due to the displaced nature of the articular fracture and came to the operating room today for this procedure. The patient did consent to the procedure after discussion of the risks and benefits.  PREOPERATIVE DIAGNOSIS: right lateral malleolus ankle fracture  POSTOPERATIVE DIAGNOSIS: Same.  PROCEDURE: Open treatment of right ankle fracture with internal fixation. Lateral malleolar CPT [phone]  SURGEON: N. Glee ArvinMichael Xu, M.D.  ASSIST: none.  ANESTHESIA:  general  TOURNIQUET TIME: less than 60 mins  IV FLUIDS AND URINE: See anesthesia.  ESTIMATED BLOOD LOSS: minimal mL.  IMPLANTS: Zimmer   COMPLICATIONS: None.  DESCRIPTION OF PROCEDURE: The patient was brought to the operating room and placed supine on the operating table.  The patient had been signed prior to the procedure and this was documented. The patient had the anesthesia placed by the anesthesiologist.  A nonsterile tourniquet was placed on the upper thigh.  The prep verification and incision time-outs were performed to confirm that this was the correct patient, site, side and location. The patient had an SCD on the opposite lower extremity. The patient did receive antibiotics prior to the incision and was re-dosed during the procedure as needed at indicated intervals.  The patient had the lower extremity prepped and draped in the standard surgical fashion.  The extremity was exsanguinated using an esmarch bandage and the tourniquet was inflated to 300 mm Hg.  Lateral incision over the distal fibula was created.  Dissection was carried down through the subcutaneous tissue.  The fascia was sharply incised.  Subperiosteal elevation was performed.  The fracture was exposed.  Entrapped periosteum was removed.  The fracture was then reduced.  The given the  orientation of the fracture like fixation was not felt to be appropriate.  With the fracture out to alignment and reduced a precontoured plate was then placed on the lateral aspect of the fibula using fluoroscopic guidance.  Nonlocking and locking screws were then placed through the plate into the fibula using standard AO technique each with excellent purchase.  The fracture remained reduced.  The clamp was then removed.  External stress test of the ankle was stable.  The wound was then thoroughly irrigated and closed in layered fashion using 0 Vicryl, 2-0 Vicryl, 3-0 nylon.  Sterile dressings were applied.  Short leg splint was placed.  Patient tolerated procedure well had no immediate complications.  POSTOPERATIVE PLAN: Alicia Nunez will remain nonweightbearing on this leg for approximately 6 weeks; Alicia Nunez will return for suture removal in 2 weeks.  He will be immobilized in a short leg splint and then transitioned to a CAM walker at his first follow up appointment.  Alicia Nunez will receive DVT prophylaxis based on other medications, activity level, and risk ratio of bleeding to thrombosis.  Alicia ReelN. Michael Xu, MD [facility] [phone] 5:05 PM

## 2017-01-26 NOTE — Anesthesia Procedure Notes (Signed)
Anesthesia Regional Block: Adductor canal block   Pre-Anesthetic Checklist: ,, timeout performed, Correct Patient, Correct Site, Correct Laterality, Correct Procedure, Correct Position, site marked, Risks and benefits discussed,  Surgical consent,  Pre-op evaluation,  At surgeon's request and post-op pain management  Laterality: Right  Prep: chloraprep       Needles:  Injection technique: Single-shot  Needle Type: Echogenic Needle     Needle Length: 9cm  Needle Gauge: 21     Additional Needles:   Procedures:,,,, ultrasound used (permanent image in chart),,,,  Narrative:  Start time: 01/26/2017 3:11 PM End time: 01/26/2017 3:17 PM Injection made incrementally with aspirations every 5 mL.  Performed by: Personally  Anesthesiologist: Marcene DuosFitzgerald, Syeda Prickett, MD

## 2017-01-26 NOTE — Progress Notes (Signed)
Assisted Dr. Rob Fitzgerald with left, ultrasound guided, popliteal/saphenous block. Side rails up, monitors on throughout procedure. See vital signs in flow sheet. Tolerated Procedure well. 

## 2017-01-26 NOTE — Anesthesia Postprocedure Evaluation (Signed)
Anesthesia Post Note  Patient: Alicia Nunez  Procedure(s) Performed: OPEN REDUCTION INTERNAL FIXATION (ORIF) RIGHT ANKLE FRACTURE (Right Ankle)     Patient location during evaluation: PACU Anesthesia Type: General Level of consciousness: awake and alert Pain management: pain level controlled Vital Signs Assessment: post-procedure vital signs reviewed and stable Respiratory status: spontaneous breathing, nonlabored ventilation, respiratory function stable and patient connected to nasal cannula oxygen Cardiovascular status: blood pressure returned to baseline and stable Postop Assessment: no apparent nausea or vomiting Anesthetic complications: no    Last Vitals:  Vitals:   01/26/17 1730 01/26/17 1737  BP: (!) 164/66   Pulse: 87   Resp: 16   Temp:  36.4 C  SpO2: 100%     Last Pain:  Vitals:   01/26/17 1503  PainSc: 0-No pain        RLE Motor Response: Purposeful movement (01/26/17 1737) RLE Sensation: Decreased;Numbness (01/26/17 1737)      Alicia Nunez, Alicia Nunez

## 2017-01-26 NOTE — H&P (Signed)
    PREOPERATIVE H&P  Chief Complaint: right lateral malleolus fracture  HPI: Alicia Nunez is a 46 y.o. female who presents for surgical treatment of right lateral malleolus fracture.  She denies any changes in medical history.  Past Medical History:  Diagnosis Date  . GERD (gastroesophageal reflux disease)   . H/O seasonal allergies    Past Surgical History:  Procedure Laterality Date  . CESAREAN SECTION     Social History   Socioeconomic History  . Marital status: Married    Spouse name: Not on file  . Number of children: Not on file  . Years of education: Not on file  . Highest education level: Not on file  Social Needs  . Financial resource strain: Not on file  . Food insecurity - worry: Not on file  . Food insecurity - inability: Not on file  . Transportation needs - medical: Not on file  . Transportation needs - non-medical: Not on file  Occupational History  . Not on file  Tobacco Use  . Smoking status: Never Smoker  . Smokeless tobacco: Never Used  Substance and Sexual Activity  . Alcohol use: No  . Drug use: No  . Sexual activity: Not on file  Other Topics Concern  . Not on file  Social History Narrative  . Not on file   No family history on file. No Known Allergies Prior to Admission medications   Medication Sig Start Date End Date Taking? Authorizing Provider  Fexofenadine HCl (ALLEGRA ALLERGY PO) Take by mouth.    [provider]  HYDROcodone-acetaminophen (NORCO/VICODIN) 5-325 MG tablet Take 2 tablets every 4 (four) hours as needed by mouth. 01/23/17   Eber HongMiller, Brian, MD  ibuprofen (ADVIL,MOTRIN) 800 MG tablet Take 1 tablet (800 mg total) 3 (three) times daily by mouth. 01/23/17   Eber HongMiller, Brian, MD  naproxen (NAPROSYN) 500 MG tablet Take 1 tablet (500 mg total) by mouth 2 (two) times daily. 01/17/15   Pricilla LovelessGoldston, Scott, MD  ranitidine (ZANTAC) 150 MG tablet Take 150 mg by mouth 2 (two) times daily.    [provider]     Positive  ROS: All other systems have been reviewed and were otherwise negative with the exception of those mentioned in the HPI and as above.  Physical Exam: General: Alert, no acute distress Cardiovascular: No pedal edema Respiratory: No cyanosis, no use of accessory musculature GI: abdomen soft Skin: No lesions in the area of chief complaint Neurologic: Sensation intact distally Psychiatric: Patient is competent for consent with normal mood and affect Lymphatic: no lymphedema  MUSCULOSKELETAL: exam stable  Assessment: right lateral malleolus fracture  Plan: Plan for Procedure(s): OPEN REDUCTION INTERNAL FIXATION (ORIF) RIGHT ANKLE FRACTURE  The risks benefits and alternatives were discussed with the patient including but not limited to the risks of nonoperative treatment, versus surgical intervention including infection, bleeding, nerve injury,  blood clots, cardiopulmonary complications, morbidity, mortality, among others, and they were willing to proceed.   Glee ArvinMichael Emiliya Chretien, MD   01/26/2017 2:10 PM

## 2017-01-26 NOTE — Anesthesia Procedure Notes (Signed)
Procedure Name: LMA Insertion Date/Time: 01/26/2017 4:03 PM Performed by: Pearson Grippeobertson, Sheril Hammond M, CRNA Pre-anesthesia Checklist: Patient identified, Emergency Drugs available, Suction available and Patient being monitored Patient Re-evaluated:Patient Re-evaluated prior to induction Oxygen Delivery Method: Circle system utilized Preoxygenation: Pre-oxygenation with 100% oxygen Induction Type: IV induction Ventilation: Mask ventilation without difficulty LMA: LMA inserted LMA Size: 4.0 Number of attempts: 1 Airway Equipment and Method: Bite block Placement Confirmation: positive ETCO2 Tube secured with: Tape Dental Injury: Teeth and Oropharynx as per pre-operative assessment

## 2017-01-26 NOTE — Anesthesia Preprocedure Evaluation (Signed)
Anesthesia Evaluation  Patient identified by MRN, date of birth, ID band Patient awake    Reviewed: Allergy & Precautions, NPO status , Patient's Chart, lab work & pertinent test results  Airway Mallampati: II  TM Distance: >3 FB Neck ROM: Full    Dental  (+) Dental Advisory Given   Pulmonary neg pulmonary ROS,    breath sounds clear to auscultation       Cardiovascular negative cardio ROS   Rhythm:Regular Rate:Normal     Neuro/Psych negative neurological ROS     GI/Hepatic Neg liver ROS, GERD  ,  Endo/Other  negative endocrine ROS  Renal/GU negative Renal ROS     Musculoskeletal   Abdominal   Peds  Hematology negative hematology ROS (+)   Anesthesia Other Findings   Reproductive/Obstetrics                             Anesthesia Physical Anesthesia Plan  ASA: II  Anesthesia Plan: General   Post-op Pain Management:  Regional for Post-op pain   Induction: Intravenous  PONV Risk Score and Plan: 3 and Midazolam, Dexamethasone, Ondansetron and Treatment may vary due to age or medical condition  Airway Management Planned: LMA  Additional Equipment:   Intra-op Plan:   Post-operative Plan: Extubation in OR  Informed Consent: I have reviewed the patients History and Physical, chart, labs and discussed the procedure including the risks, benefits and alternatives for the proposed anesthesia with the patient or authorized representative who has indicated his/her understanding and acceptance.   Dental advisory given  Plan Discussed with: CRNA  Anesthesia Plan Comments:         Anesthesia Quick Evaluation

## 2017-01-26 NOTE — Anesthesia Procedure Notes (Signed)
Anesthesia Regional Block: Popliteal block   Pre-Anesthetic Checklist: ,, timeout performed, Correct Patient, Correct Site, Correct Laterality, Correct Procedure, Correct Position, site marked, Risks and benefits discussed,  Surgical consent,  Pre-op evaluation,  At surgeon's request and post-op pain management  Laterality: Right  Prep: chloraprep       Needles:  Injection technique: Single-shot  Needle Type: Echogenic Needle     Needle Length: 9cm  Needle Gauge: 21     Additional Needles:   Procedures:,,,, ultrasound used (permanent image in chart),,,,  Narrative:  Start time: 01/26/2017 3:03 PM End time: 01/26/2017 3:10 PM Injection made incrementally with aspirations every 5 mL.  Performed by: Personally  Anesthesiologist: Marcene DuosFitzgerald, Butler Vegh, MD

## 2017-01-27 ENCOUNTER — Telehealth (INDEPENDENT_AMBULATORY_CARE_PROVIDER_SITE_OTHER): Payer: Self-pay | Admitting: Orthopaedic Surgery

## 2017-01-27 NOTE — Telephone Encounter (Signed)
Patient request a temporary handicap form. Please call when ready

## 2017-01-27 NOTE — Telephone Encounter (Signed)
6 months

## 2017-01-27 NOTE — Telephone Encounter (Signed)
See message below. If yes, for how long?

## 2017-01-28 ENCOUNTER — Encounter (HOSPITAL_BASED_OUTPATIENT_CLINIC_OR_DEPARTMENT_OTHER): Payer: Self-pay | Admitting: Orthopaedic Surgery

## 2017-01-28 NOTE — Telephone Encounter (Signed)
At front desk for pick up. Patient advised.  

## 2017-02-08 ENCOUNTER — Ambulatory Visit (INDEPENDENT_AMBULATORY_CARE_PROVIDER_SITE_OTHER): Payer: Self-pay | Admitting: Orthopaedic Surgery

## 2017-02-08 ENCOUNTER — Ambulatory Visit (INDEPENDENT_AMBULATORY_CARE_PROVIDER_SITE_OTHER): Payer: Self-pay

## 2017-02-08 DIAGNOSIS — S8261XA Displaced fracture of lateral malleolus of right fibula, initial encounter for closed fracture: Secondary | ICD-10-CM

## 2017-02-08 MED ORDER — IBUPROFEN 800 MG PO TABS: 800.0000 mg | ORAL_TABLET | Freq: Three times a day (TID) | ORAL | 3 refills | Status: AC | PRN

## 2017-02-08 MED ORDER — OXYCODONE-ACETAMINOPHEN 5-325 MG PO TABS: 1.0000 | ORAL_TABLET | Freq: Two times a day (BID) | ORAL | 0 refills | Status: AC | PRN

## 2017-02-08 NOTE — Progress Notes (Signed)
Kaisy is 2 weeks status post ORIF right fibula fracture.  She is doing well overall.  Her pain is well controlled.  She is taking oxycodone and ibuprofen and Zanaflex.  She is ambulating with a knee scooter.  Her incision is healed without signs of infection.  The x-rays show stable fixation and alignment of the fracture without complication.  The sutures were removed today.  Continue nonweightbearing for 2 more weeks.  Handicap pass for 6 months.  Follow-up in 2 weeks with three-view x-rays of the right ankle.  Anticipate allowing her to weight-bear at that time.

## 2017-02-22 ENCOUNTER — Ambulatory Visit (INDEPENDENT_AMBULATORY_CARE_PROVIDER_SITE_OTHER): Payer: Self-pay | Admitting: Orthopaedic Surgery

## 2017-02-22 ENCOUNTER — Ambulatory Visit (INDEPENDENT_AMBULATORY_CARE_PROVIDER_SITE_OTHER): Payer: Self-pay

## 2017-02-22 ENCOUNTER — Encounter (INDEPENDENT_AMBULATORY_CARE_PROVIDER_SITE_OTHER): Payer: Self-pay | Admitting: Orthopaedic Surgery

## 2017-02-22 DIAGNOSIS — S8261XA Displaced fracture of lateral malleolus of right fibula, initial encounter for closed fracture: Secondary | ICD-10-CM

## 2017-02-22 DIAGNOSIS — M25571 Pain in right ankle and joints of right foot: Secondary | ICD-10-CM

## 2017-02-22 NOTE — Progress Notes (Signed)
Patient is 4 weeks status post ORIF right lateral malleolus fracture.  Overall she is doing well.  Pain is well controlled.  She has minimal swelling.  The incision is healed.  X-rays show stable fixation and alignment of the fracture.  Patient may now weight-bear as tolerated in a fracture boot.  Referral to physical therapy.  Follow-up in 4 weeks for recheck.  3 view x-rays of the right ankle on return.

## 2017-03-22 ENCOUNTER — Ambulatory Visit (INDEPENDENT_AMBULATORY_CARE_PROVIDER_SITE_OTHER): Payer: Self-pay

## 2017-03-22 ENCOUNTER — Ambulatory Visit (INDEPENDENT_AMBULATORY_CARE_PROVIDER_SITE_OTHER): Payer: Self-pay | Admitting: Orthopaedic Surgery

## 2017-03-22 DIAGNOSIS — M25571 Pain in right ankle and joints of right foot: Secondary | ICD-10-CM

## 2017-03-22 NOTE — Progress Notes (Signed)
   Office Visit Note   Patient: Alicia Nunez           Date of Birth: 02/14/1971           MRN: [id number] Visit Date: 03/22/2017              Requested by: Lester CarolinaMcFadden, John C., MD [address] [place], [place] [id number] PCP: Lester CarolinaMcFadden, John C., MD   Assessment & Plan: Visit Diagnoses:  1. Pain in right ankle and joints of right foot     Plan: Today we have placed Alicia Nunez in an ASO ankle brace.  She is to wear this with activity.  She is going to continue to ice and elevate her ankle.  I have also given her prescription for outpatient physical therapy to work on ankle Alicia Nunez of motion and stabilization exercises.  She will follow-up with Alicia Nunez in 4 weeks time for repeat evaluation and three-view x-ray.  Follow-Up Instructions: Return in about 4 weeks (around 04/19/2017).   Orders:  Orders Placed This Encounter  Procedures  . XR Ankle Complete Right   No orders of the defined types were placed in this encounter.     Procedures: No procedures performed   Clinical Data: No additional findings.   Subjective: Chief Complaint  Patient presents with  . Right Ankle - Follow-up    HPI Alicia Nunez comes in for follow-up.  55 days postop ORIF right ankle fracture date of surgery 01/26/2017.  Doing well.  She transitioned out of her Cam walker into regular shoe 2 weeks ago.  Some pain going up and down stairs but not significant.  She has noticed a fair amount of swelling towards the end of each day.  She has been icing and elevating as much as possible.  Review of Systems as detailed in HPI.  All others reviewed and are negative.   Objective: Vital Signs: There were no vitals taken for this visit.  Physical Exam well-developed well-nourished female in no acute distress.  Alert and oriented x3.    Ortho Exam examination of the right ankle reveals marked tenderness at the lateral malleolus.  Moderate swelling.  Painless Alicia Nunez of motion of the ankle, although this is limited secondary to  stiffness.  Specialty Comments:  No specialty comments available.  Imaging: Xr Ankle Complete Right  Result Date: 03/22/2017 Imaging of the right ankle reveals a well aligned fracture and hardware.    PMFS History: Patient Active Problem List   Diagnosis Date Noted  . Displaced fracture of lateral malleolus of right fibula, initial encounter for closed fracture 01/25/2017   Past Medical History:  Diagnosis Date  . GERD (gastroesophageal reflux disease)   . H/O seasonal allergies     No family history on file.  Past Surgical History:  Procedure Laterality Date  . CESAREAN SECTION    . ORIF ANKLE FRACTURE Right 01/26/2017   Procedure: OPEN REDUCTION INTERNAL FIXATION (ORIF) RIGHT ANKLE FRACTURE;  Surgeon: Tarry KosXu, Naiping M, MD;  Location: [facility];  Service: Orthopedics;  Laterality: Right;   Social History   Occupational History  . Not on file  Tobacco Use  . Smoking status: Never Smoker  . Smokeless tobacco: Never Used  Substance and Sexual Activity  . Alcohol use: No  . Drug use: No  . Sexual activity: Not on file

## 2017-04-19 ENCOUNTER — Ambulatory Visit (INDEPENDENT_AMBULATORY_CARE_PROVIDER_SITE_OTHER): Payer: Self-pay | Admitting: Orthopaedic Surgery

## 2017-04-19 ENCOUNTER — Encounter (INDEPENDENT_AMBULATORY_CARE_PROVIDER_SITE_OTHER): Payer: Self-pay | Admitting: Orthopaedic Surgery

## 2017-04-19 ENCOUNTER — Ambulatory Visit (INDEPENDENT_AMBULATORY_CARE_PROVIDER_SITE_OTHER): Payer: Self-pay

## 2017-04-19 DIAGNOSIS — M25571 Pain in right ankle and joints of right foot: Secondary | ICD-10-CM

## 2017-04-19 NOTE — Progress Notes (Signed)
Patient is almost 3 months status post ORIF right lateral malleolus fracture.  She has occasional discomfort and swelling that is worse with activity she is doing very well.  She did yoga on her own.  She is essentially resumed all regular activities.  Her surgical scar is fully healed.  X-rays show evidence of radiographic healing.  At this point I am going to release her to activity as tolerated.  Questions encouraged and answered.  Follow-up as needed.

## 2018-01-27 ENCOUNTER — Other Ambulatory Visit: Payer: Self-pay | Admitting: Registered Nurse

## 2018-01-27 DIAGNOSIS — R921 Mammographic calcification found on diagnostic imaging of breast: Secondary | ICD-10-CM

## 2018-02-27 IMAGING — RF DG ANKLE COMPLETE 3+V*R*
1 series · 4 of 4 positions shown · non-contrast
Comparison: 01/23/2017

CLINICAL DATA: ORIF of right fibular fracture

EXAM:
DG C-ARM 61-120 MIN; RIGHT ANKLE - COMPLETE 3+ VIEW

[Series 1: run · 4 of 4 slices shown]
[im 1/4]
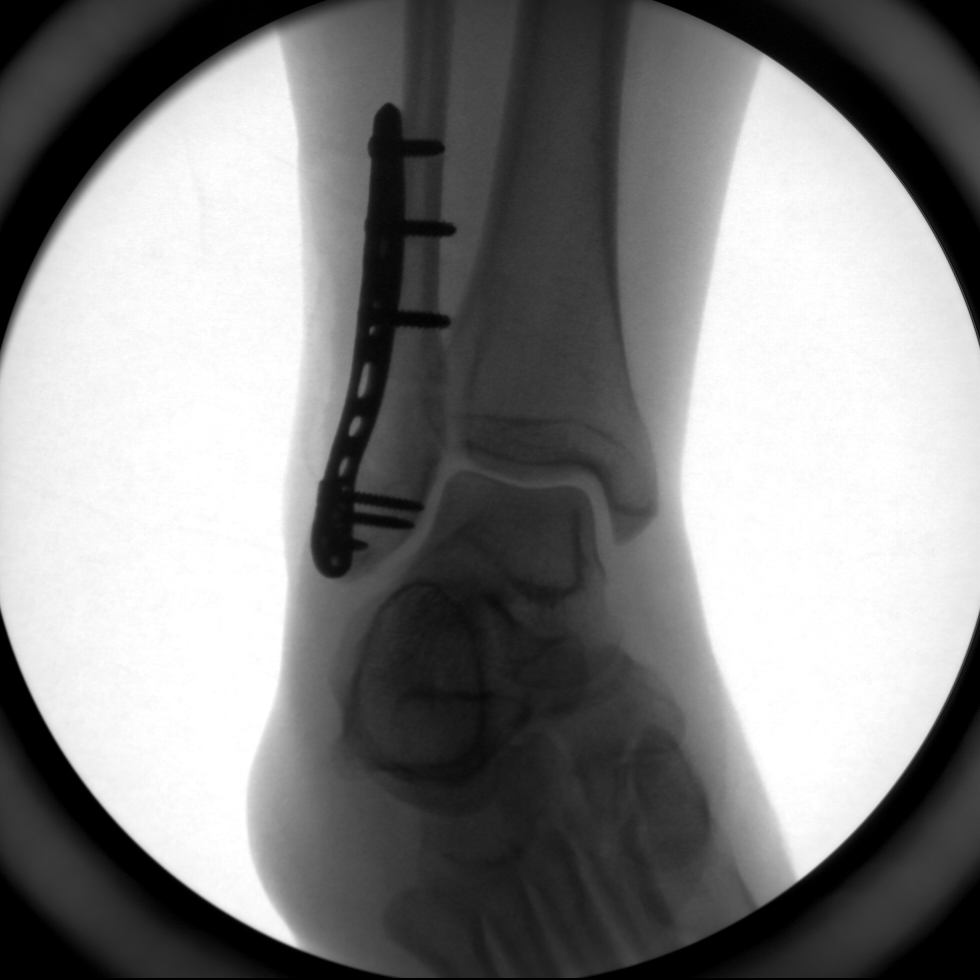
[im 2/4]
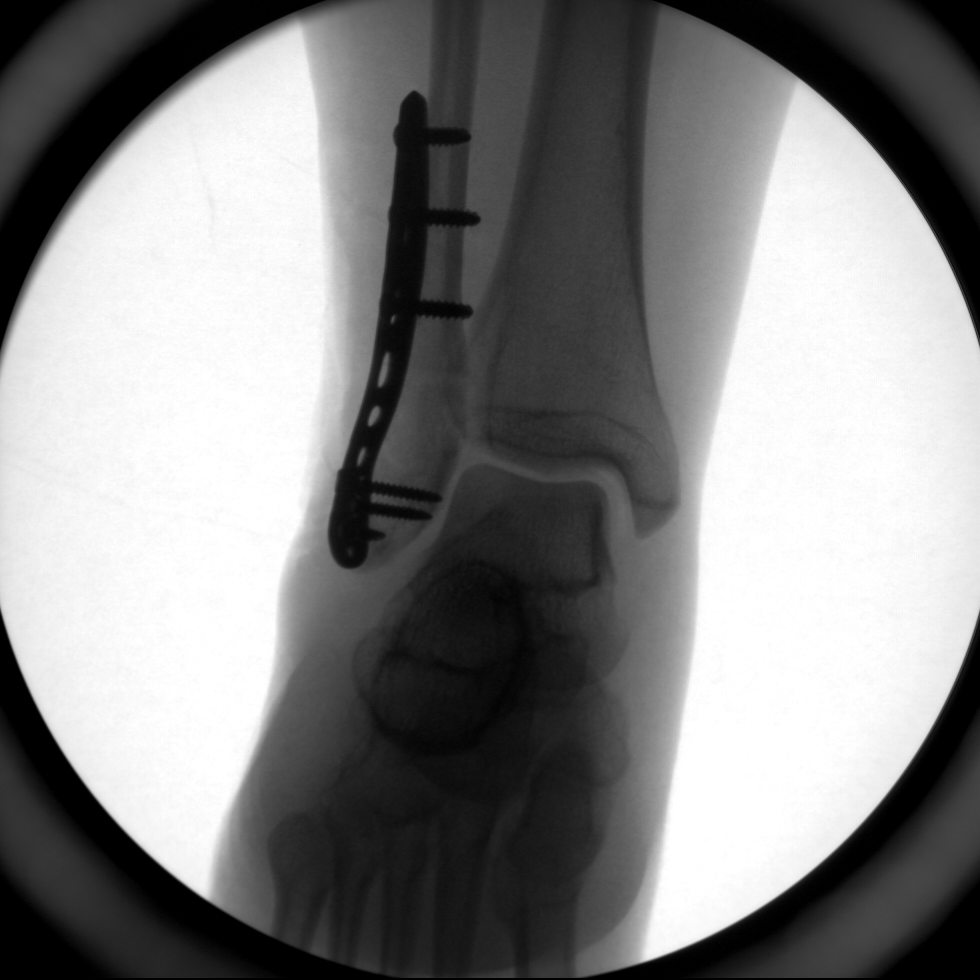
[im 3/4]
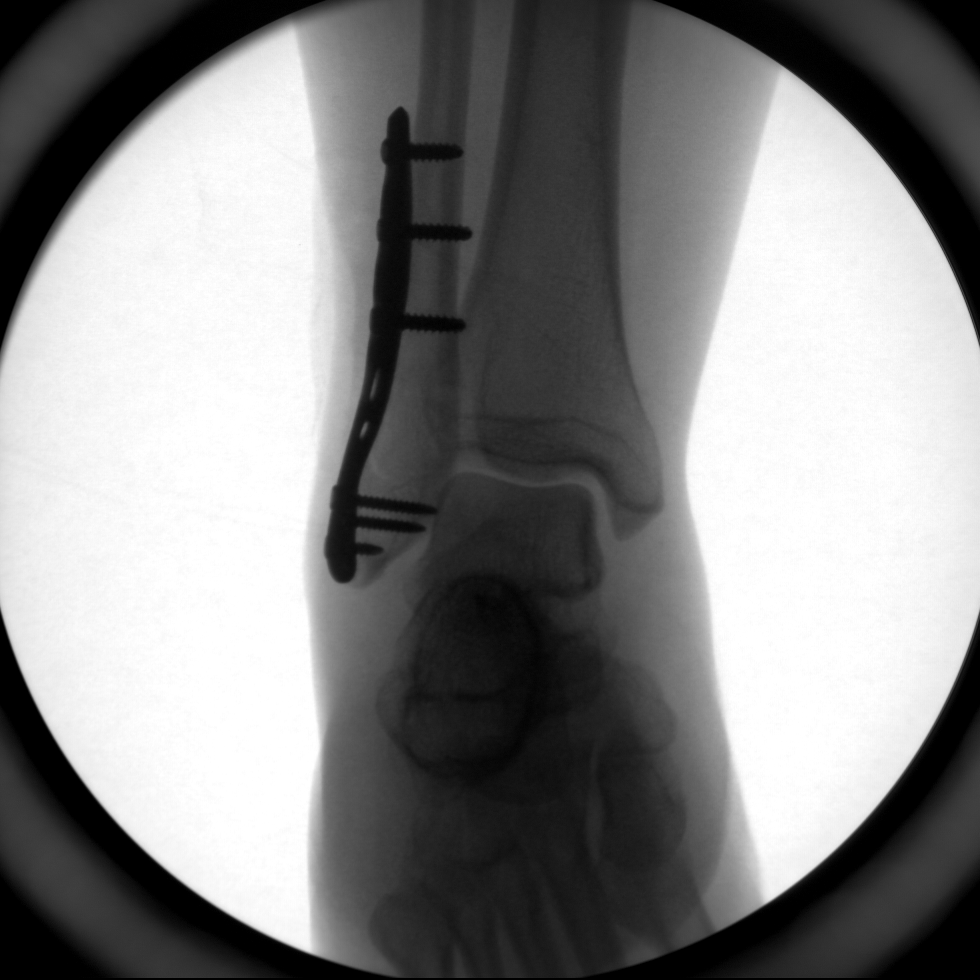
[im 4/4]
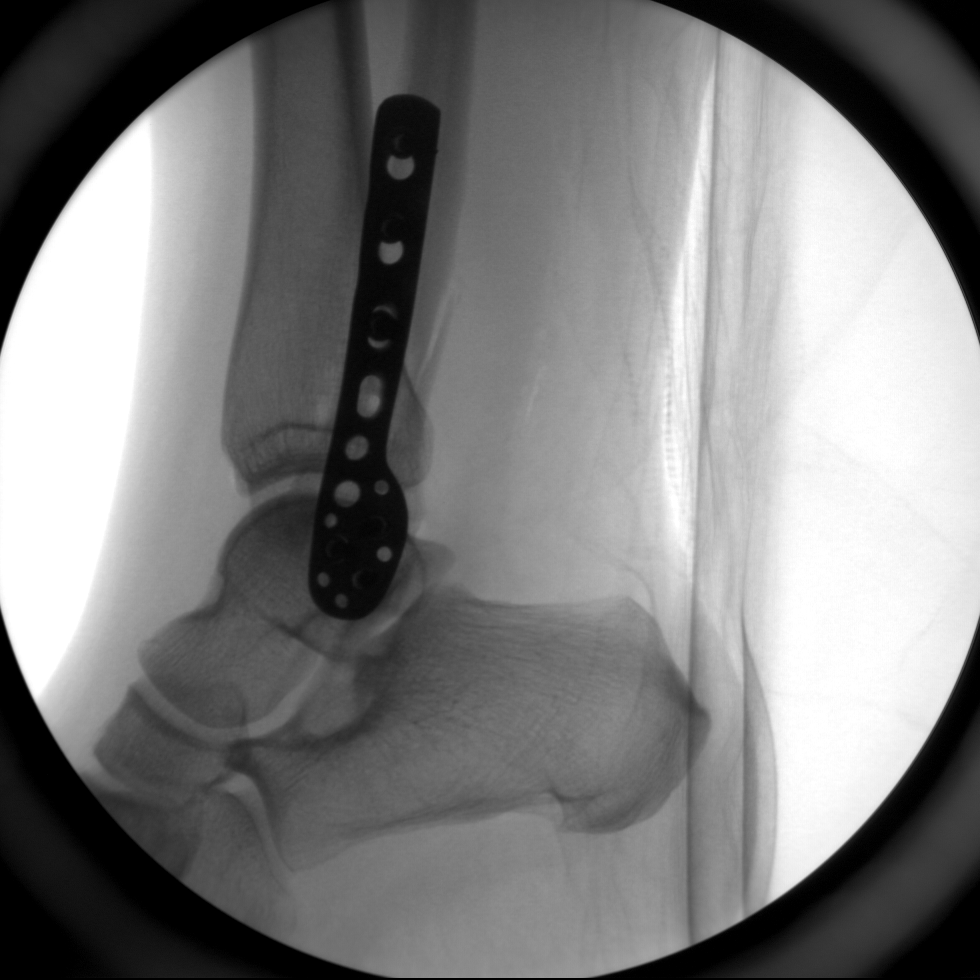

[4 of 4 positions shown; findings below may reference images not displayed]

FLUOROSCOPY TIME:  Fluoroscopy Time:  24 seconds

Radiation Exposure Index (if provided by the fluoroscopic device):
Not available

Number of Acquired Spot Images: 4
FINDINGS: Fixation sideplate is noted along the distal fibula. The fracture
fragments are in near anatomic alignment. No new focal abnormality
is seen.
IMPRESSION: ORIF of distal right fibular fracture
